# Patient Record
Sex: Female | Born: 1970 | Race: Black or African American | Hispanic: No | Marital: Single | Smoking: Former smoker
Health system: Southern US, Community
[De-identification: ages and names within clinical notes are randomized; demographics above are authoritative.]

## PROBLEM LIST (undated history)

## (undated) DIAGNOSIS — K219 Gastro-esophageal reflux disease without esophagitis: Secondary | ICD-10-CM

## (undated) DIAGNOSIS — R202 Paresthesia of skin: Secondary | ICD-10-CM

## (undated) DIAGNOSIS — R7989 Other specified abnormal findings of blood chemistry: Secondary | ICD-10-CM

## (undated) DIAGNOSIS — E079 Disorder of thyroid, unspecified: Secondary | ICD-10-CM

## (undated) HISTORY — DX: Gastro-esophageal reflux disease without esophagitis: K21.9

## (undated) HISTORY — DX: Other specified abnormal findings of blood chemistry: R79.89

## (undated) HISTORY — DX: Paresthesia of skin: R20.2

## (undated) HISTORY — DX: Disorder of thyroid, unspecified: E07.9

---

## 2002-12-03 ENCOUNTER — Other Ambulatory Visit: Admission: RE | Admit: 2002-12-03 | Discharge: 2002-12-03 | Payer: Self-pay | Admitting: Obstetrics and Gynecology

## 2003-09-23 ENCOUNTER — Ambulatory Visit (HOSPITAL_COMMUNITY): Admission: RE | Admit: 2003-09-23 | Discharge: 2003-09-23 | Payer: Self-pay | Admitting: Obstetrics and Gynecology

## 2010-09-12 ENCOUNTER — Encounter: Payer: Self-pay | Admitting: Obstetrics and Gynecology

## 2016-11-25 ENCOUNTER — Other Ambulatory Visit: Payer: Self-pay | Admitting: Gastroenterology

## 2016-11-25 DIAGNOSIS — R1033 Periumbilical pain: Secondary | ICD-10-CM

## 2016-12-03 ENCOUNTER — Ambulatory Visit
Admission: RE | Admit: 2016-12-03 | Discharge: 2016-12-03 | Disposition: A | Discharge status: Home / Self Care | Payer: BLUE CROSS/BLUE SHIELD | Source: Ambulatory Visit | Attending: Gastroenterology | Admitting: Gastroenterology

## 2016-12-03 ENCOUNTER — Other Ambulatory Visit: Payer: Self-pay

## 2016-12-03 DIAGNOSIS — R1033 Periumbilical pain: Secondary | ICD-10-CM

## 2016-12-03 IMAGING — CT CT ABD-PELV W/ CM
1 of 3 series · 13 of 32 positions shown, 18 images · IV contrast (APPLIED)
Comparison: CT scan [DATE]

CLINICAL DATA: Lower pelvic pain, right lower quadrant pain for 2
years, worsening pain last 2 months, status post hysterectomy

EXAM:
CT ABDOMEN AND PELVIS WITH CONTRAST
TECHNIQUE: Multidetector CT imaging of the abdomen and pelvis was performed
using the standard protocol following bolus administration of
intravenous contrast.
CONTRAST:  100mL [HH] IOPAMIDOL ([HH]) INJECTION 61%

[Series 2: abd/pelvis w/cm · axial · 0.86mm/px · z∈[-476,-31]mm · 13 of 99 slices shown, 18 images]
[im 5/99  soft-tissue]
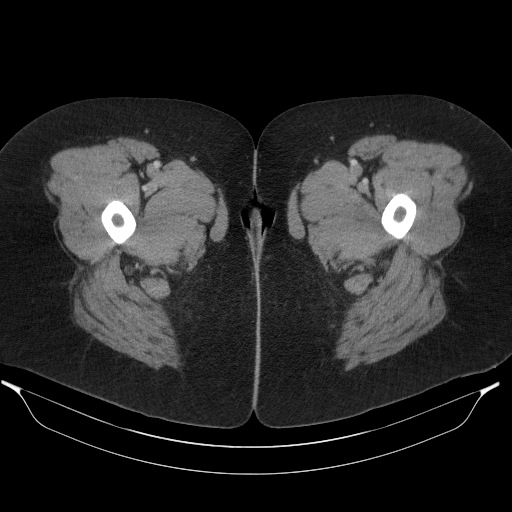
[im 5/99  bone]
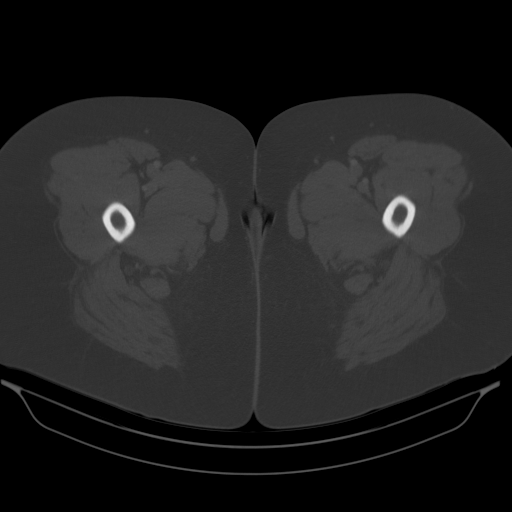
[im 15/99  soft-tissue]
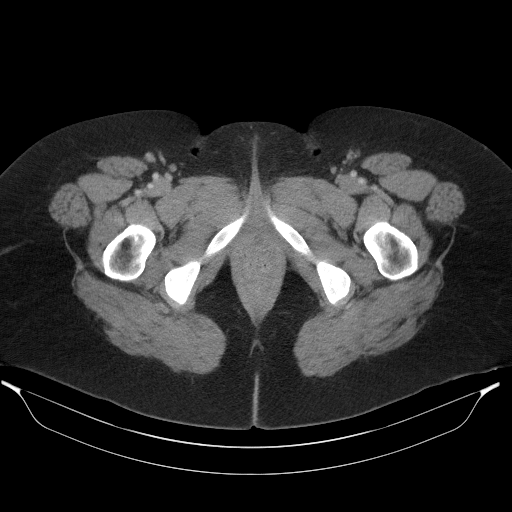
[im 20/99  soft-tissue]
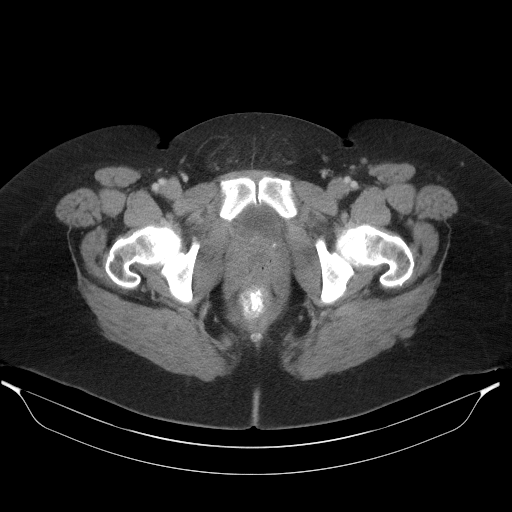
[im 30/99  soft-tissue]
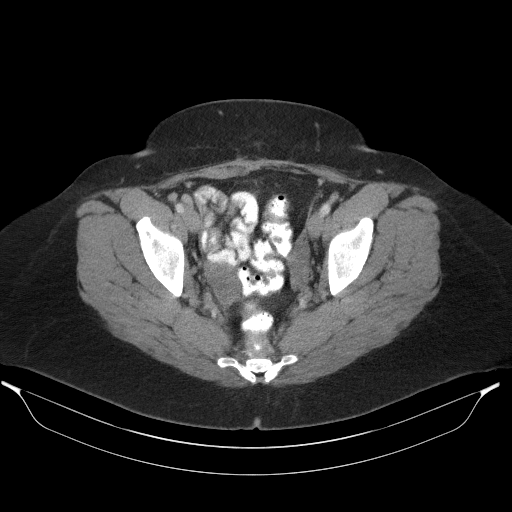
[im 40/99  soft-tissue]
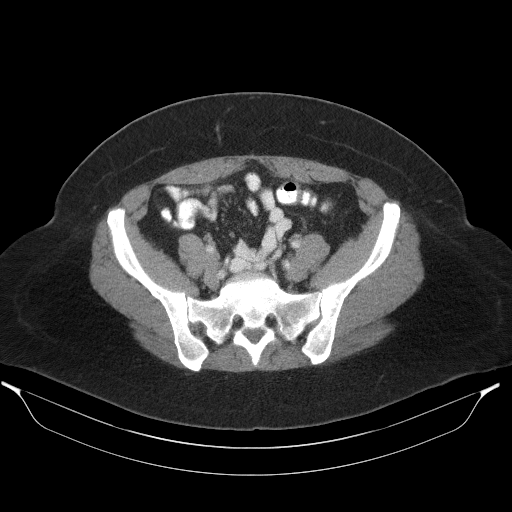
[im 45/99  soft-tissue]
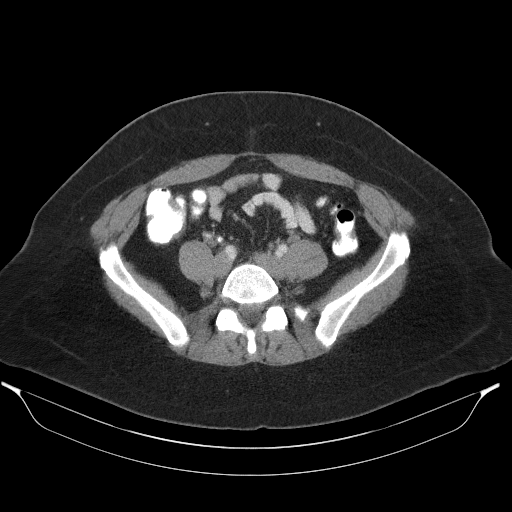
[im 54/99  soft-tissue]
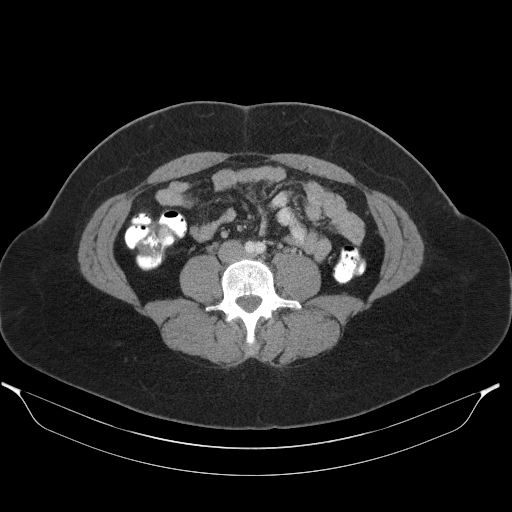
[im 59/99  soft-tissue]
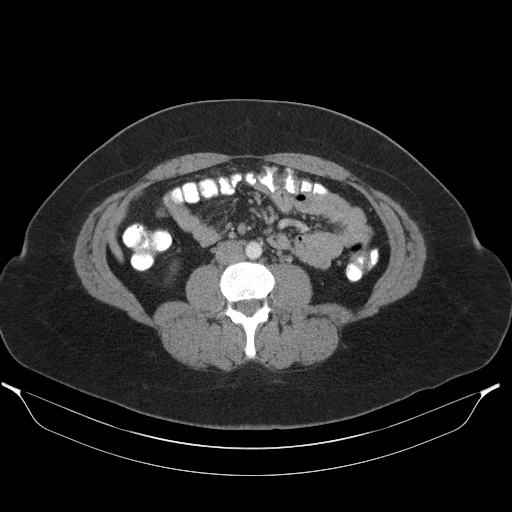
[im 69/99  soft-tissue]
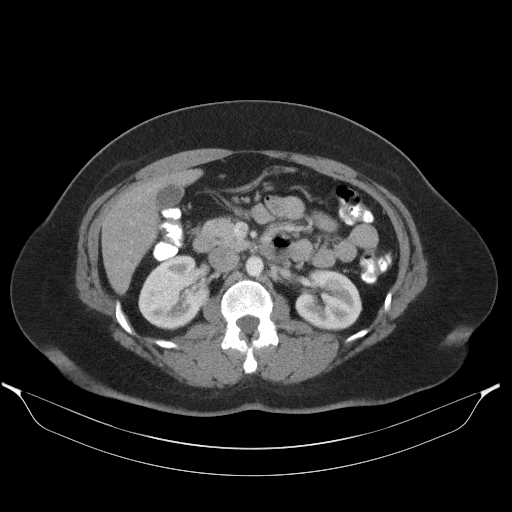
[im 69/99  bone]
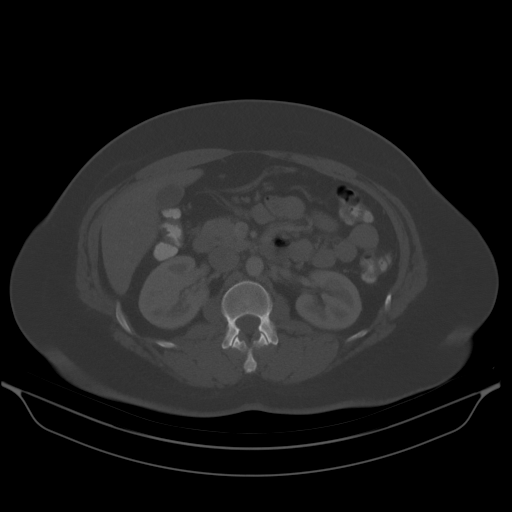
[im 79/99  soft-tissue]
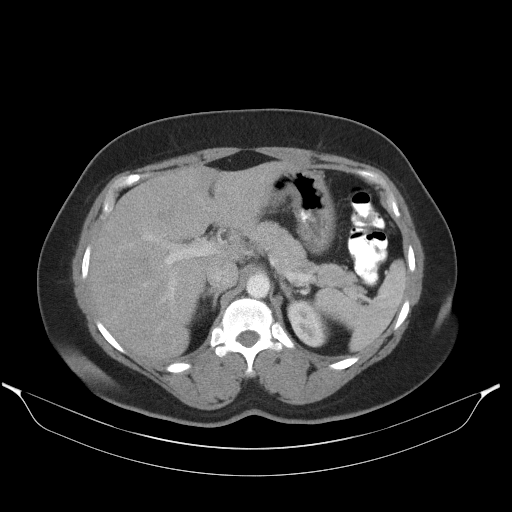
[im 79/99  lung]
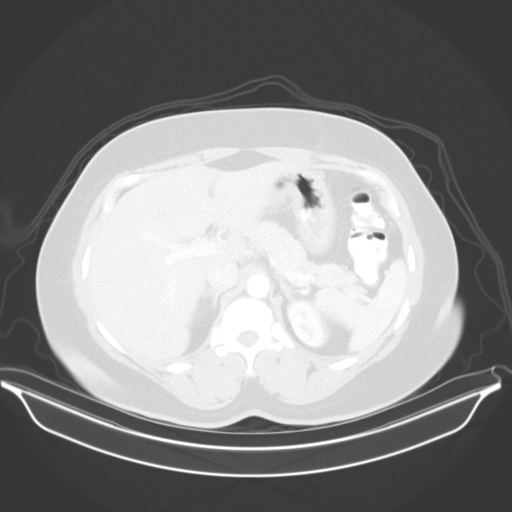
[im 84/99  soft-tissue]
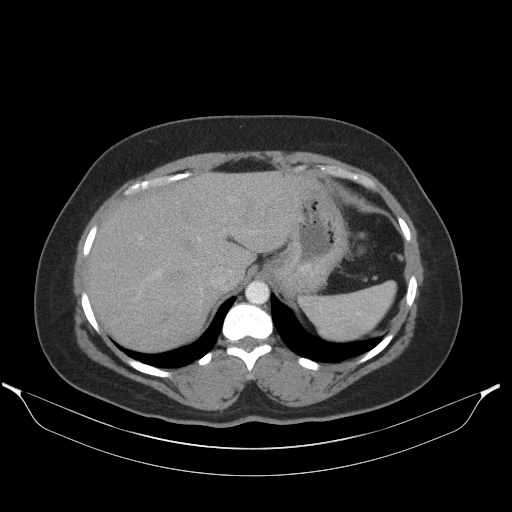
[im 84/99  lung]
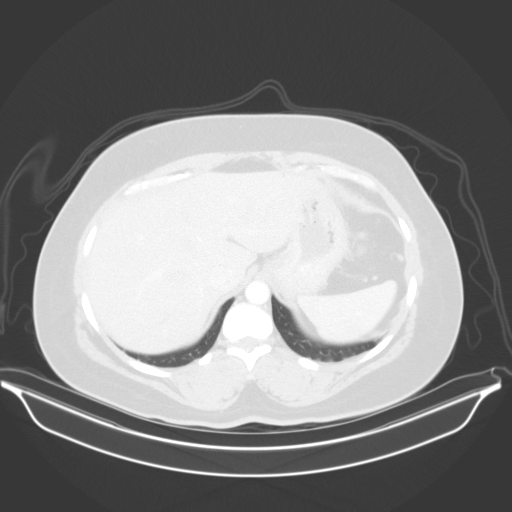
[im 89/99  lung]
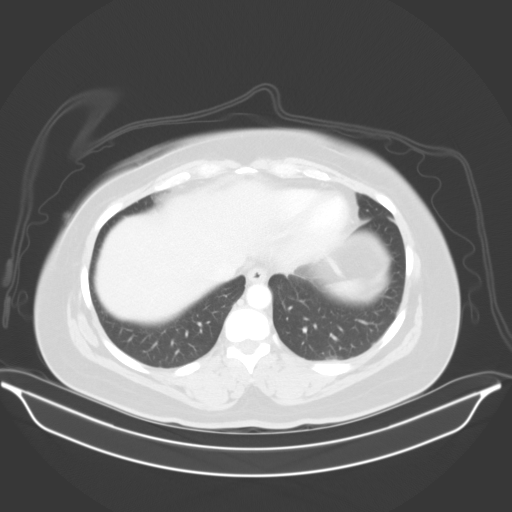
[im 94/99  soft-tissue]
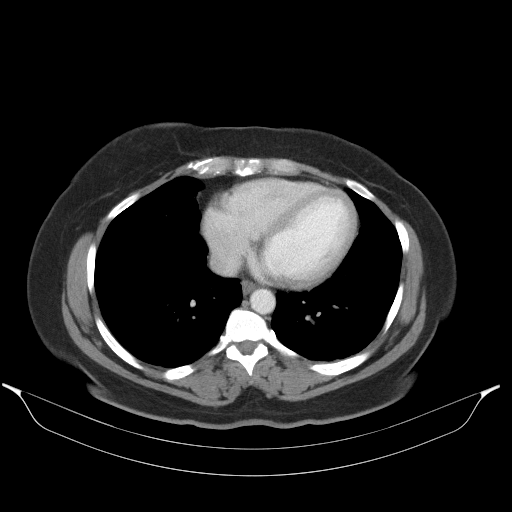
[im 94/99  lung]
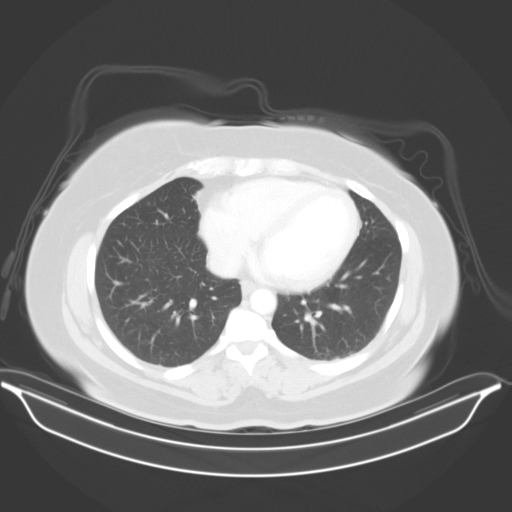

[13 of 32 positions shown; findings below may reference images not displayed]

FINDINGS: Lower chest: Lung bases shows no acute findings.

Hepatobiliary: There is mild fatty infiltration of the liver. No
calcified gallstones are noted within gallbladder. Tiny cyst right
hepatic lobe laterally measures 4 mm.

Pancreas: Unremarkable. No pancreatic ductal dilatation or
surrounding inflammatory changes.

Spleen: Normal in size without focal abnormality.

Adrenals/Urinary Tract: No adrenal gland mass. Enhanced kidneys are
symmetrical in size. No hydronephrosis or hydroureter. Delayed renal
images the shows bilateral renal symmetrical excretion. Bilateral
visualized proximal ureter is unremarkable.

Stomach/Bowel: Oral contrast material was given to the patient. No
gastric outlet obstruction. No thickened or dilated small bowel
loops. No small bowel obstruction. Contrast material noted
throughout the colon. No pericecal inflammation. Terminal ileum is
unremarkable. Normal appendix clearly visualized axial image 56.

No evidence of colitis.  No pericolonic inflammation.

Vascular/Lymphatic: No aortic aneurysm. No retroperitoneal or
mesenteric adenopathy.

Reproductive: The uterus is surgically absent. A right ovarian
simple cyst/ follicle measures 1.7 cm. Small left ovarian follicle
measures 1.2 cm. No pelvic free fluid.

Other: No ascites or free abdominal air. The urinary bladder is
unremarkable.

Musculoskeletal: No destructive bony lesions are noted. Sagittal
images of the spine shows minimal degenerative changes lower
thoracic spine.
IMPRESSION: 1. There is no evidence of acute inflammatory process within
abdomen.
2. Mild fatty infiltration of the liver.
3. No small bowel or colonic obstruction. No pericecal inflammation.
Normal appendix clearly visualized.
4. No evidence of acute colitis or pericolonic inflammation.
5. Surgically absent uterus. Follicle/cyst within right ovary
measures 1.7 cm, a follicle within left ovary measures 1.2 cm. No
pelvic free fluid or adenopathy.
6. No hydronephrosis or hydroureter.

## 2016-12-03 MED ORDER — IOPAMIDOL (ISOVUE-300) INJECTION 61%
100.0000 mL | Freq: Once | INTRAVENOUS | Status: AC | PRN
  Administered 2016-12-03: 100 mL via INTRAVENOUS

## 2019-08-02 ENCOUNTER — Encounter: Payer: Self-pay | Admitting: Neurology

## 2019-08-02 ENCOUNTER — Other Ambulatory Visit: Payer: Self-pay

## 2019-08-02 ENCOUNTER — Encounter

## 2019-08-02 ENCOUNTER — Ambulatory Visit: Payer: BC Managed Care – PPO | Admitting: Neurology

## 2019-08-02 VITALS — BP 130/78 | HR 52 | Temp 97.6°F | Ht 66.5 in | Wt 212.0 lb

## 2019-08-02 DIAGNOSIS — R202 Paresthesia of skin: Secondary | ICD-10-CM | POA: Diagnosis not present

## 2019-08-02 DIAGNOSIS — M5441 Lumbago with sciatica, right side: Secondary | ICD-10-CM | POA: Diagnosis not present

## 2019-08-02 DIAGNOSIS — G8929 Other chronic pain: Secondary | ICD-10-CM

## 2019-08-02 NOTE — Progress Notes (Signed)
PATIENT: Samantha Vargas DOB: 04-15-1971  Chief Complaint  Patient presents with  . Extremity Weakness    Reports tingling her bilateral legs, below the knee, present since the beginning of October 2020.  Symptoms vary in intensity.  Her right leg is much worse than left.  Marland Kitchen PCP    Samantha Maywood, FNP  . Orthopaedics    Reynaldo Minium, MD - referring provider     HISTORICAL  Samantha Vargas is a 48 year old female, seen in request by her primary care nurse practitioner Thornton Papas, orthopedic surgeon Dr. Darrel Hoover for evaluation of right leg numbness, initial evaluation was on August 02, 2019.  I have reviewed and summarized the referring note from the referring physician.  She had past medical history of hypothyroidism, on supplement, work-up in October 2020, noticed right leg numbness from knee down, as if just waking up from local anesthesia, she also complains of right groin area radiating pain, had a history of mild low back pain, she denies left lower extremity involvement, no bilateral upper extremity involvement, denies significant gait difficulty, does has mild right medial knee pain,  She denies bowel and bladder incontinence, over the past few months, her symptoms are persistent,   REVIEW OF SYSTEMS: Full 14 system review of systems performed and notable only for as above All other review of systems were negative.  ALLERGIES: Allergies  Allergen Reactions  . Sulfa Antibiotics Rash    HOME MEDICATIONS: Current Outpatient Medications  Medication Sig Dispense Refill  . Biotin w/ Vitamins C & E (HAIR/SKIN/NAILS PO) Take 1 tablet by mouth daily.    Marland Kitchen levothyroxine (SYNTHROID) 137 MCG tablet Take 1 tablet by mouth daily.    Marland Kitchen MAGNESIUM PO Take 500 mg by mouth daily.    Marland Kitchen VITAMIN D PO Take 1,000 Units by mouth daily.     No current facility-administered medications for this visit.    PAST MEDICAL HISTORY: Past Medical History:  Diagnosis Date  . Acid reflux    . Low vitamin D level   . Thyroid disease   . Tingling     PAST SURGICAL HISTORY: Past Surgical History:  Procedure Laterality Date  . ABDOMINAL HYSTERECTOMY     2008  . [place], 2005  . ENDOMETRIAL BIOPSY     2019 - removal  . THYROIDECTOMY     2010    FAMILY HISTORY: Family History  Problem Relation Age of Onset  . Dementia Mother   . Stroke Mother   . Heart failure Father   . Hypertension Father   . Diabetes Father     SOCIAL HISTORY: Social History   Socioeconomic History  . Marital status: Single    Spouse name: Not on file  . Number of children: 2  . Years of education: college  . Highest education level: Bachelor's degree (e.g., BA, AB, BS)  Occupational History  . Occupation: Education officer, environmental  Tobacco Use  . Smoking status: Former Smoker    Quit date: 2004    Years since quitting: 16.9  . Smokeless tobacco: Never Used  Substance and Sexual Activity  . Alcohol use: Yes    Comment: occasional  . Drug use: Never  . Sexual activity: Not on file  Other Topics Concern  . Not on file  Social History Narrative   Lives at home with husband and children.   Right-handed.   2 cups caffeine per day.   Social Determinants of Health  Financial Resource Strain:   . Difficulty of Paying Living Expenses: Not on file  Food Insecurity:   . Worried About Programme researcher, broadcasting/film/video in the Last Year: Not on file  . Ran Out of Food in the Last Year: Not on file  Transportation Needs:   . Lack of Transportation (Medical): Not on file  . Lack of Transportation (Non-Medical): Not on file  Physical Activity:   . Days of Exercise per Week: Not on file  . Minutes of Exercise per Session: Not on file  Stress:   . Feeling of Stress : Not on file  Social Connections:   . Frequency of Communication with Friends and Family: Not on file  . Frequency of Social Gatherings with Friends and Family: Not on file  . Attends Religious Services: Not on file  .  Active Member of Clubs or Organizations: Not on file  . Attends Banker Meetings: Not on file  . Marital Status: Not on file  Intimate Partner Violence:   . Fear of Current or Ex-Partner: Not on file  . Emotionally Abused: Not on file  . Physically Abused: Not on file  . Sexually Abused: Not on file     PHYSICAL EXAM   Vitals:   08/02/19 0753  BP: 130/78  Pulse: (!) 52  Temp: 97.6 F (36.4 C)  Weight: 212 lb (96.2 kg)  Height: 5' 6.5" (1.689 m)    Not recorded      Body mass index is 33.71 kg/m.  PHYSICAL EXAMNIATION:  Gen: NAD, conversant, well nourised, well groomed                     Cardiovascular: Regular rate rhythm, no peripheral edema, warm, nontender. Eyes: Conjunctivae clear without exudates or hemorrhage Neck: Supple, no carotid bruits. Pulmonary: Clear to auscultation bilaterally   NEUROLOGICAL EXAM:  MENTAL STATUS: Speech:    Speech is normal; fluent and spontaneous with normal comprehension.  Cognition:     Orientation to time, place and person     Normal recent and remote memory     Normal Attention span and concentration     Normal Language, naming, repeating,spontaneous speech     Fund of knowledge   CRANIAL NERVES: CN II: Visual fields are full to confrontation.  Pupils are round equal and briskly reactive to light. CN III, IV, VI: extraocular movement are normal. No ptosis. CN V: Facial sensation is intact CN VII: Face is symmetric with normal eye closure and smile. CN VIII: Hearing is normal to causal conversation. CN IX, X: Phonation is normal. CN XI: Head turning and shoulder shrug are intact  MOTOR: There is no pronator drift of out-stretched arms. Muscle bulk and tone are normal. Muscle strength is normal.  Mild bilateral median knee tenderness upon deep palpitation  REFLEXES: Reflexes are 2+ and symmetric at the biceps, triceps, knees, and ankles. Plantar responses are flexor.  SENSORY: Intact to light touch,  pinprick and vibratory sensation are intact in fingers and toes.  COORDINATION: There is no trunk or limb dysmetria noted.  GAIT/STANCE: Posture is normal. Gait is steady with normal steps, base, arm swing, and turning. Heel and toe walking are normal. Tandem gait is normal.  Romberg is absent.   DIAGNOSTIC DATA (LABS, IMAGING, TESTING) - I reviewed patient records, labs, notes, testing and imaging myself where available.   ASSESSMENT AND PLAN  SYBELLA HARNISH is a 48 y.o. female   Right lower extremity paresthesia from  knee down Chronic low back pain, right groin area radiating pain  Need to rule out right lumbar radiculopathy,  Proceed with MRI of lumbar spine  EMG nerve conduction study   Levert FeinsteinYijun Kenlee Vogt, M.D. Ph.D.  [facility] [address], [address] [place], [place] [address] Ph: [phone]) [phone] Fax: [phone]  CC: Vanessa DurhamStanfield, Amy L, FNP, Laney PotashHermann, Mark, MD

## 2019-08-08 ENCOUNTER — Encounter: Payer: BC Managed Care – PPO | Admitting: Neurology

## 2019-08-13 ENCOUNTER — Telehealth: Payer: Self-pay | Admitting: Neurology

## 2019-08-13 NOTE — Telephone Encounter (Signed)
Pt returned call to schedule MRI

## 2019-08-21 ENCOUNTER — Other Ambulatory Visit: Payer: Self-pay

## 2019-08-21 ENCOUNTER — Ambulatory Visit: Payer: BC Managed Care – PPO

## 2019-08-21 DIAGNOSIS — R202 Paresthesia of skin: Secondary | ICD-10-CM

## 2019-08-21 DIAGNOSIS — M5441 Lumbago with sciatica, right side: Secondary | ICD-10-CM

## 2019-08-21 DIAGNOSIS — G8929 Other chronic pain: Secondary | ICD-10-CM | POA: Diagnosis not present

## 2019-08-22 ENCOUNTER — Telehealth: Payer: Self-pay | Admitting: *Deleted

## 2019-08-22 NOTE — Telephone Encounter (Signed)
-----   Message from Marcial Pacas, MD sent at 08/22/2019  3:01 PM EST ----- Please call patient for normal MRI lumbar

## 2019-08-22 NOTE — Telephone Encounter (Signed)
I spoke to the patient and she verbalized understanding of her MRI results. 

## 2019-08-27 NOTE — Telephone Encounter (Signed)
Patient has been scheduled and MRI completed. DWD

## 2019-10-03 ENCOUNTER — Encounter: Payer: BC Managed Care – PPO | Admitting: Neurology

## 2019-10-03 ENCOUNTER — Other Ambulatory Visit: Payer: Self-pay

## 2019-10-03 ENCOUNTER — Ambulatory Visit (INDEPENDENT_AMBULATORY_CARE_PROVIDER_SITE_OTHER): Payer: BC Managed Care – PPO | Admitting: Neurology

## 2019-10-03 DIAGNOSIS — G8929 Other chronic pain: Secondary | ICD-10-CM

## 2019-10-03 DIAGNOSIS — R202 Paresthesia of skin: Secondary | ICD-10-CM

## 2019-10-03 DIAGNOSIS — M5441 Lumbago with sciatica, right side: Secondary | ICD-10-CM

## 2019-10-03 DIAGNOSIS — Z0289 Encounter for other administrative examinations: Secondary | ICD-10-CM

## 2019-10-03 NOTE — Procedures (Signed)
Full Name: Samantha Vargas Gender: Female MRN #: [id number] Date of Birth: 08-30-1970    Visit Date: 10/03/2019 08:57 Age: 49 Years Examining Physician: Marcial Pacas, MD  Referring Physician: Marcial Pacas, MD History: 49 year old female, complains of right-sided low back pain, radiating pain to right lower extremity  Summary of the tests:  Nerve conduction study:  Bilateral sural, superficial peroneal sensory responses and bilateral tibial, peroneal to EDB motor responses were normal.  Bilateral tibial H reflexes were present and symmetric.  Electromyography:  Selected needle examination of bilateral lower extremity muscles and bilateral lumbosacral paraspinal muscles were normal.  Conclusion: This is a normal study, there is no electrodiagnostic evidence of large fiber peripheral neuropathy or bilateral lumbosacral radiculopathy.    ------------------------------- Marcial Pacas, M.D. PhD  [facility] [place],  [address] Tel: [phone] Fax: [phone]         [facility]    Nerve / Sites Muscle Latency Ref. Amplitude Ref. Rel Amp Segments Distance Velocity Ref. Area    ms ms mV mV %  cm m/s m/s mVms  R Peroneal - EDB     Ankle EDB 4.1 ?6.5 11.3 ?2.0 100 Ankle - EDB 9   31.8     Fib head EDB 9.6  10.4  91.8 Fib head - Ankle 30 54 ?44 31.2     Pop fossa EDB 11.5  10.0  96 Pop fossa - Fib head 10 54 ?44 30.1         Pop fossa - Ankle      L Peroneal - EDB     Ankle EDB 3.9 ?6.5 10.9 ?2.0 100 Ankle - EDB 9   32.2     Fib head EDB 9.7  10.0  91.8 Fib head - Ankle 30 52 ?44 29.8     Pop fossa EDB 11.4  9.6  96.1 Pop fossa - Fib head 10 58 ?44 29.1         Pop fossa - Ankle      R Tibial - AH     Ankle AH 4.3 ?5.8 8.1 ?4.0 100 Ankle - AH 9   13.8     Pop fossa AH 12.3  7.8  96.5 Pop fossa - Ankle 38 47 ?41 15.3  L Tibial - AH     Ankle AH 4.1 ?5.8 7.1 ?4.0 100 Ankle - AH 9   24.2     Pop fossa AH 11.7  5.5  78.1 Pop fossa - Ankle 38 50  ?41 15.1             SNC    Nerve / Sites Rec. Site Peak Lat Ref.  Amp Ref. Segments Distance    ms ms V V  cm  R Sural - Ankle (Calf)     Calf Ankle 3.3 ?4.4 26 ?6 Calf - Ankle 14  L Sural - Ankle (Calf)     Calf Ankle 3.6 ?4.4 23 ?6 Calf - Ankle 14  R Superficial peroneal - Ankle     Lat leg Ankle 3.4 ?4.4 21 ?6 Lat leg - Ankle 14  L Superficial peroneal - Ankle     Lat leg Ankle 3.5 ?4.4 19 ?6 Lat leg - Ankle 14             F  Wave    Nerve F Lat Ref.   ms ms  R Tibial - AH 50.9 ?56.0  L Tibial - AH 50.7 ?56.0  H Reflex    Nerve H Lat Lat Hmax   ms ms   Left Right Ref. Left Right Ref.  Tibial - Soleus 19.9 33.2 ?35.0 21.9 36.2 ?35.0         EMG Summary Table    Spontaneous MUAP Recruitment  Muscle IA Fib PSW Fasc Other Amp Dur. Poly Pattern  R. Tibialis anterior Normal None None None _______ Normal Normal Normal Normal  R. Tibialis posterior Normal None None None _______ Normal Normal Normal Normal  R. Peroneus longus Normal None None None _______ Normal Normal Normal Normal  R. Gastrocnemius (Medial head) Normal None None None _______ Normal Normal Normal Normal  R. Vastus lateralis Normal None None None _______ Normal Normal Normal Normal  R. Lumbar paraspinals (low) Normal None None None _______ Normal Normal Normal Normal  L. Tibialis anterior Normal None None None _______ Normal Normal Normal Normal  L. Gastrocnemius (Medial head) Normal None None None _______ Normal Normal Normal Normal  L. Lumbar paraspinals Normal None None None _______ Normal Normal Normal Normal
